# Patient Record
Sex: Female | Born: 1980 | Race: Black or African American | Hispanic: No | Marital: Married | Smoking: Former smoker
Health system: Southern US, Community
[De-identification: ages and names within clinical notes are randomized; demographics above are authoritative.]

## PROBLEM LIST (undated history)

## (undated) ENCOUNTER — Inpatient Hospital Stay (HOSPITAL_COMMUNITY): Payer: Self-pay

## (undated) HISTORY — PX: DILATION AND CURETTAGE OF UTERUS: SHX78

## (undated) HISTORY — PX: BREAST REDUCTION SURGERY: SHX8

## (undated) HISTORY — PX: BREAST SURGERY: SHX581

---

## 2001-05-13 ENCOUNTER — Emergency Department (HOSPITAL_COMMUNITY): Admission: EM | Admit: 2001-05-13 | Discharge: 2001-05-13 | Payer: Self-pay | Admitting: Emergency Medicine

## 2001-10-11 ENCOUNTER — Other Ambulatory Visit: Admission: RE | Admit: 2001-10-11 | Discharge: 2001-10-11 | Payer: Self-pay | Admitting: Obstetrics and Gynecology

## 2002-02-12 ENCOUNTER — Emergency Department (HOSPITAL_COMMUNITY): Admission: EM | Admit: 2002-02-12 | Discharge: 2002-02-12 | Payer: Self-pay

## 2002-06-09 ENCOUNTER — Ambulatory Visit (HOSPITAL_COMMUNITY): Admission: RE | Admit: 2002-06-09 | Discharge: 2002-06-09 | Payer: Self-pay | Admitting: Orthopedic Surgery

## 2002-06-09 ENCOUNTER — Encounter (INDEPENDENT_AMBULATORY_CARE_PROVIDER_SITE_OTHER): Payer: Self-pay | Admitting: Specialist

## 2004-05-17 ENCOUNTER — Emergency Department (HOSPITAL_COMMUNITY): Admission: EM | Admit: 2004-05-17 | Discharge: 2004-05-17 | Payer: Self-pay | Admitting: Emergency Medicine

## 2005-07-25 ENCOUNTER — Emergency Department (HOSPITAL_COMMUNITY): Admission: EM | Admit: 2005-07-25 | Discharge: 2005-07-25 | Payer: Self-pay | Admitting: Emergency Medicine

## 2010-03-10 ENCOUNTER — Ambulatory Visit (HOSPITAL_BASED_OUTPATIENT_CLINIC_OR_DEPARTMENT_OTHER)
Admission: RE | Admit: 2010-03-10 | Discharge: 2010-03-10 | Payer: Self-pay | Source: Home / Self Care | Attending: Otolaryngology | Admitting: Otolaryngology

## 2010-07-15 NOTE — Op Note (Signed)
   [name], Theresa Colon                          ACCOUNT NO.:  [id number]   MEDICAL RECORD NO.:  [id number]                   PATIENT TYPE:  AMB   LOCATION:  DAY                                  FACILITY:  [facility]   PHYSICIAN:  Marlowe Kays, M.D.               DATE OF BIRTH:  09/18/1980   DATE OF PROCEDURE:  03/10/2002  DATE OF DISCHARGE:                                 OPERATIVE REPORT   PREOPERATIVE DIAGNOSIS:  Ganglion cyst versus cyst of extensor tendon  sheath, right thumb.   POSTOPERATIVE DIAGNOSIS:  Apparent benign vascular tumor, right thumb.   OPERATION:  Excision of vascular tumor, right thumb.   SURGEON:  Illene Labrador. [place]plington, M.D.   ASSISTANT:  Nurse.   ANESTHESIA:  IV regional.   PATHOLOGY AND JUSTIFICATION FOR PROCEDURE:  She has had progressive pain and  swelling in her right thumb for about a year.  No known injury.  X-rays are  normal.  It is felt this is most likely a benign lesion, and no MRI was  obtained.  She is here today for surgical excision.   DESCRIPTION OF PROCEDURE:  Satisfactory IV, regional anesthesia, DuraPrep to  right upper extremity from mid forearm to fingertips, and this was draped in  a sterile field.  I made a dorsoradial incision over the mass which was  basically located over the distal portion of the first metacarpal.  Dorsal  sensor nerve was identified and protected.  A venous plexus immediately came  into view, and I dissected this out cautiously, protecting the dorsal  sensory nerve.  It appeared to take origin from a deep vein adjacent to the  extensor tendons.  This area was sectioned at the base and the specimen sent  for pathology.  I then cauterized the site of origin, and there was a small  little defect there, and I closed this over with interrupted 4-0 Vicryl.  I  likewise closed the subcutaneous tissue with the same and the skin with  running 5-0 nylon.  Betadine, Adaptic dry sterile dressing were applied.  Tourniquet was  released.  At the time of this dictation, she was on her way  to the recovery room in satisfactory condition with no known complications.                                               Marlowe Kays, M.D.    JA/MEDQ  D:  06/09/2002  T:  06/09/2002  Job:  [id number]

## 2010-08-02 ENCOUNTER — Other Ambulatory Visit: Payer: Self-pay | Admitting: Plastic Surgery

## 2011-04-11 ENCOUNTER — Other Ambulatory Visit: Payer: Self-pay | Admitting: Orthopedic Surgery

## 2011-10-17 ENCOUNTER — Encounter (HOSPITAL_BASED_OUTPATIENT_CLINIC_OR_DEPARTMENT_OTHER): Payer: Self-pay | Admitting: *Deleted

## 2011-10-17 ENCOUNTER — Emergency Department (HOSPITAL_BASED_OUTPATIENT_CLINIC_OR_DEPARTMENT_OTHER)
Admission: EM | Admit: 2011-10-17 | Discharge: 2011-10-17 | Disposition: A | Discharge status: Home / Self Care | Payer: 59 | Attending: Emergency Medicine | Admitting: Emergency Medicine

## 2011-10-17 DIAGNOSIS — R509 Fever, unspecified: Secondary | ICD-10-CM | POA: Insufficient documentation

## 2011-10-17 DIAGNOSIS — J069 Acute upper respiratory infection, unspecified: Secondary | ICD-10-CM | POA: Insufficient documentation

## 2011-10-17 MED ORDER — IBUPROFEN 800 MG PO TABS
800.0000 mg | ORAL_TABLET | Freq: Once | ORAL | Status: AC
  Administered 2011-10-17: 800 mg via ORAL
  Filled 2011-10-17: qty 1

## 2011-10-17 MED ORDER — HYDROCODONE-ACETAMINOPHEN 7.5-500 MG/15ML PO SOLN: 15.0000 mL | Freq: Four times a day (QID) | ORAL | Status: AC | PRN

## 2011-10-17 NOTE — ED Provider Notes (Signed)
History     CSN: [id number]  Arrival date & time 10/17/11  1821   First MD Initiated Contact with Patient 10/17/11 1844      Chief Complaint  Patient presents with  . Sore Throat  . Fever    (Consider location/radiation/quality/duration/timing/severity/associated sxs/prior treatment) Patient is a 31 y.o. female presenting with URI. The history is provided by the patient.  URI The primary symptoms include fever, fatigue, sore throat, cough and myalgias. Primary symptoms do not include rash. The current episode started yesterday. This is a new problem. The problem has been gradually worsening.  The fever began today. The maximum temperature recorded prior to her arrival was 101 to 101.9 F.  The onset of the illness is associated with exposure to sick contacts. Symptoms associated with the illness include chills, congestion and rhinorrhea.    History reviewed. No pertinent past medical history.  Past Surgical History  Procedure Date  . Breast surgery     History reviewed. No pertinent family history.  History  Substance Use Topics  . Smoking status: Not on file  . Smokeless tobacco: Not on file  . Alcohol Use: No    OB History    Grav Para Term Preterm Abortions TAB SAB Ect Mult Living                  Review of Systems  Constitutional: Positive for fever, chills and fatigue.  HENT: Positive for congestion, sore throat and rhinorrhea.   Respiratory: Positive for cough.   Musculoskeletal: Positive for myalgias.  Skin: Negative for rash.  All other systems reviewed and are negative.    Allergies  Review of patient's allergies indicates no known allergies.  Home Medications  No current outpatient prescriptions on file.  BP 122/72  Pulse 98  Temp 101.2 F (38.4 C) (Oral)  Resp 16  Ht 5\' 5"  (1.651 m)  Wt 230 lb (104.327 kg)  BMI 38.27 kg/m2  SpO2 100%  LMP 10/07/2011  Physical Exam  Nursing note and vitals reviewed. Constitutional: She is oriented to  person, place, and time. She appears well-developed and well-nourished. No distress.  HENT:  Head: Normocephalic and atraumatic.  Right Ear: Tympanic membrane and ear canal normal.  Left Ear: Tympanic membrane and ear canal normal.  Nose: Mucosal edema and rhinorrhea present.  Mouth/Throat: Mucous membranes are normal. Posterior oropharyngeal erythema present. No oropharyngeal exudate, posterior oropharyngeal edema or tonsillar abscesses.  Eyes: Conjunctivae and EOM are normal. Pupils are equal, round, and reactive to light.  Neck: Normal range of motion. Neck supple.  Cardiovascular: Normal rate, regular rhythm and intact distal pulses.   No murmur heard. Pulmonary/Chest: Effort normal and breath sounds normal. No respiratory distress. She has no wheezes. She has no rales.  Abdominal: Soft. She exhibits no distension. There is no tenderness. There is no rebound and no guarding.  Musculoskeletal: Normal range of motion. She exhibits no edema and no tenderness.  Lymphadenopathy:    She has no cervical adenopathy.  Neurological: She is alert and oriented to person, place, and time.  Skin: Skin is warm and dry. No rash noted. No erythema.  Psychiatric: She has a normal mood and affect. Her behavior is normal.    ED Course  Procedures (including critical care time)   Labs Reviewed  RAPID STREP SCREEN   No results found.   No diagnosis found.    MDM   Pt with symptoms consistent with viral URI.  Well appearing here.  No signs of  breathing difficulty  No signs of pharyngitis, otitis or abnormal abdominal findings.   Rapid strep neg.  Feel most likely viral in origin.        Gwyneth Sprout, MD 10/17/11 1935

## 2011-10-17 NOTE — ED Notes (Signed)
Pt c/o sore throat and fever  X 2 days

## 2011-10-18 LAB — STREP A DNA PROBE
Group A Strep Probe: NEGATIVE
Special Requests: NORMAL

## 2013-02-25 ENCOUNTER — Encounter (HOSPITAL_BASED_OUTPATIENT_CLINIC_OR_DEPARTMENT_OTHER): Payer: Self-pay | Admitting: Emergency Medicine

## 2013-02-25 ENCOUNTER — Emergency Department (HOSPITAL_BASED_OUTPATIENT_CLINIC_OR_DEPARTMENT_OTHER)
Admission: EM | Admit: 2013-02-25 | Discharge: 2013-02-25 | Disposition: A | Discharge status: Home / Self Care | Payer: No Typology Code available for payment source | Attending: Emergency Medicine | Admitting: Emergency Medicine

## 2013-02-25 DIAGNOSIS — F17200 Nicotine dependence, unspecified, uncomplicated: Secondary | ICD-10-CM | POA: Insufficient documentation

## 2013-02-25 DIAGNOSIS — J029 Acute pharyngitis, unspecified: Secondary | ICD-10-CM

## 2013-02-25 MED ORDER — ONDANSETRON 4 MG PO TBDP: 4.0000 mg | ORAL_TABLET | Freq: Three times a day (TID) | ORAL | Status: DC | PRN

## 2013-02-25 MED ORDER — HYDROCODONE-ACETAMINOPHEN 7.5-325 MG/15ML PO SOLN
10.0000 mL | Freq: Once | ORAL | Status: AC
  Administered 2013-02-25: 10 mL via ORAL
  Filled 2013-02-25: qty 15

## 2013-02-25 MED ORDER — HYDROCODONE-ACETAMINOPHEN 7.5-325 MG/15ML PO SOLN: 10.0000 mL | ORAL | Status: DC | PRN

## 2013-02-25 MED ORDER — ONDANSETRON 4 MG PO TBDP
4.0000 mg | ORAL_TABLET | Freq: Once | ORAL | Status: AC
  Administered 2013-02-25: 4 mg via ORAL
  Filled 2013-02-25: qty 1

## 2013-02-25 MED ORDER — DEXAMETHASONE SODIUM PHOSPHATE 10 MG/ML IJ SOLN
10.0000 mg | Freq: Once | INTRAMUSCULAR | Status: AC
  Administered 2013-02-25: 10 mg via INTRAMUSCULAR
  Filled 2013-02-25: qty 1

## 2013-02-25 NOTE — ED Notes (Signed)
[facility].  Pt reports taking all medications prescribed but not getting better.  Pt reports sore throat, headache, body aches and decrease fluid intake.  Took aleve prior to arrival

## 2013-02-25 NOTE — ED Provider Notes (Signed)
CSN: [id number]     Arrival date & time 02/25/13  0431 History   First MD Initiated Contact with Patient 02/25/13 0441     Chief Complaint  Patient presents with  . Sore Throat   (Consider location/radiation/quality/duration/timing/severity/associated sxs/prior Treatment) HPI This is a 32 year old female with a three-day history of sore throat, fever, headache, body aches and general malaise. She was seen in a walk-in clinic 2 days ago, diagnosed with influenza, and put on Tamiflu, Aleve and an unspecified gargle. A rapid strep test was negative. Despite these her sore throat is worse and she rates her pain a 10 out of 10 especially with swallowing. The pain is preventing her from coughing as well. She is having nausea but no vomiting and has had diarrhea.  History reviewed. No pertinent past medical history. Past Surgical History  Procedure Laterality Date  . Breast reduction surgery    . Breast surgery     No family history on file. History  Substance Use Topics  . Smoking status: Current Some Day Smoker    Types: Cigarettes  . Smokeless tobacco: Not on file  . Alcohol Use: No   OB History   Grav Para Term Preterm Abortions TAB SAB Ect Mult Living                 Review of Systems  All other systems reviewed and are negative.    Allergies  Review of patient's allergies indicates no known allergies.  Home Medications  No current outpatient prescriptions on file. BP 136/90  Pulse 86  Temp(Src) 99.7 F (37.6 C) (Oral)  Resp 20  Ht 5\' 5"  (1.651 m)  Wt 240 lb (108.863 kg)  BMI 39.94 kg/m2  SpO2 98%  LMP 02/07/2013  Physical Exam General: Well-developed, well-nourished female in no acute distress; appearance consistent with age of record HENT: normocephalic; atraumatic; pharyngeal erythema and exudate; no trismus; uvula midline; no dysphonia; TMs normal Eyes: pupils equal, round and reactive to light; extraocular muscles intact Neck: supple Heart: regular rate and  rhythm Lungs: clear to auscultation bilaterally Abdomen: soft; nondistended; nontender Extremities: No deformity; full range of motion Neurologic: Awake, alert and oriented; motor function intact in all extremities and symmetric; no facial droop Skin: Warm and dry Psychiatric: Flat affect    ED Course  Procedures (including critical care time)  MDM      Hanley Seamen, MD 02/25/13 [phone]

## 2013-02-27 ENCOUNTER — Encounter (HOSPITAL_BASED_OUTPATIENT_CLINIC_OR_DEPARTMENT_OTHER): Payer: Self-pay | Admitting: Emergency Medicine

## 2013-02-27 ENCOUNTER — Emergency Department (HOSPITAL_BASED_OUTPATIENT_CLINIC_OR_DEPARTMENT_OTHER)
Admission: EM | Admit: 2013-02-27 | Discharge: 2013-02-27 | Disposition: A | Discharge status: Home / Self Care | Payer: No Typology Code available for payment source | Attending: Emergency Medicine | Admitting: Emergency Medicine

## 2013-02-27 DIAGNOSIS — J039 Acute tonsillitis, unspecified: Secondary | ICD-10-CM | POA: Insufficient documentation

## 2013-02-27 DIAGNOSIS — F17200 Nicotine dependence, unspecified, uncomplicated: Secondary | ICD-10-CM | POA: Insufficient documentation

## 2013-02-27 DIAGNOSIS — H9209 Otalgia, unspecified ear: Secondary | ICD-10-CM | POA: Insufficient documentation

## 2013-02-27 DIAGNOSIS — R110 Nausea: Secondary | ICD-10-CM | POA: Insufficient documentation

## 2013-02-27 LAB — RAPID STREP SCREEN (MED CTR MEBANE ONLY): STREPTOCOCCUS, GROUP A SCREEN (DIRECT): NEGATIVE

## 2013-02-27 MED ORDER — HYDROCODONE-ACETAMINOPHEN 5-325 MG PO TABS
2.0000 | ORAL_TABLET | Freq: Once | ORAL | Status: AC
  Administered 2013-02-27: 2 via ORAL
  Filled 2013-02-27: qty 2

## 2013-02-27 MED ORDER — METHYLPREDNISOLONE SODIUM SUCC 125 MG IJ SOLR
125.0000 mg | Freq: Once | INTRAMUSCULAR | Status: AC
  Administered 2013-02-27: 125 mg via INTRAMUSCULAR
  Filled 2013-02-27: qty 2

## 2013-02-27 MED ORDER — HYDROCODONE-ACETAMINOPHEN 5-325 MG PO TABS: 2.0000 | ORAL_TABLET | ORAL | Status: DC | PRN

## 2013-02-27 MED ORDER — PENICILLIN V POTASSIUM 500 MG PO TABS: 500.0000 mg | ORAL_TABLET | Freq: Four times a day (QID) | ORAL | Status: AC

## 2013-02-27 NOTE — ED Provider Notes (Signed)
CSN: [id number]     Arrival date & time 02/27/13  1850 History   None    Chief Complaint  Patient presents with  . Influenza   (Consider location/radiation/quality/duration/timing/severity/associated sxs/prior Treatment) Patient is a 33 y.o. female presenting with flu symptoms. The history is provided by the patient. No language interpreter was used.  Influenza Presenting symptoms: fever, nausea and sore throat   Severity:  Severe Onset quality:  Gradual Duration:  7 days Progression:  Worsening Relieved by:  Nothing Worsened by:  Nothing tried Associated symptoms: ear pain   Risk factors: no diabetes problem     History reviewed. No pertinent past medical history. Past Surgical History  Procedure Laterality Date  . Breast reduction surgery    . Breast surgery     No family history on file. History  Substance Use Topics  . Smoking status: Current Some Day Smoker    Types: Cigarettes  . Smokeless tobacco: Not on file  . Alcohol Use: No   OB History   Grav Para Term Preterm Abortions TAB SAB Ect Mult Living                 Review of Systems  Constitutional: Positive for fever.  HENT: Positive for ear pain and sore throat.   Gastrointestinal: Positive for nausea.  All other systems reviewed and are negative.    Allergies  Review of patient's allergies indicates no known allergies.  Home Medications   Current Outpatient Rx  Name  Route  Sig  Dispense  Refill  . HYDROcodone-acetaminophen (HYCET) 7.5-325 mg/15 ml solution   Oral   Take 10 mLs by mouth every 4 (four) hours as needed (for pain).   120 mL   0   . ondansetron (ZOFRAN ODT) 4 MG disintegrating tablet   Oral   Take 1 tablet (4 mg total) by mouth every 8 (eight) hours as needed for nausea or vomiting. 4mg  ODT q4 hours prn nausea/vomit   10 tablet   0    BP 142/78  Pulse 85  Temp(Src) 98.3 F (36.8 C) (Oral)  Resp 18  Ht 5\' 5"  (1.651 m)  Wt 240 lb (108.863 kg)  BMI 39.94 kg/m2  SpO2 100%   LMP 02/07/2013 Physical Exam  Nursing note and vitals reviewed. Constitutional: She appears well-developed.  HENT:  Head: Normocephalic and atraumatic.  Mouth/Throat: Oropharyngeal exudate present.  Eyes: Conjunctivae are normal. Pupils are equal, round, and reactive to light.  Neck: Normal range of motion.  Cardiovascular: Normal rate, regular rhythm and normal heart sounds.   Pulmonary/Chest: Effort normal and breath sounds normal.  Abdominal: Soft. Bowel sounds are normal.  Musculoskeletal: Normal range of motion.  Neurological: She is alert.  Skin: Skin is warm.    ED Course  Procedures (including critical care time) Labs Review Labs Reviewed  RAPID STREP SCREEN  CULTURE, GROUP A STREP   Imaging Review No results found.  EKG Interpretation   None       MDM   1. Tonsillitis    Strep is negative.   I will treat with pcn due to exudate and swelling.   Pt advised to see Dr. Kathrynn RunningManning for rehceck in 2 days    Elson AreasLeslie K Elasia Furnish, PA-C 02/27/13 2149

## 2013-02-27 NOTE — Discharge Instructions (Signed)
Sore Throat A sore throat is pain, burning, irritation, or scratchiness of the throat. There is often pain or tenderness when swallowing or talking. A sore throat may be accompanied by other symptoms, such as coughing, sneezing, fever, and swollen neck glands. A sore throat is often the first sign of another sickness, such as a cold, flu, strep throat, or mononucleosis (commonly known as mono). Most sore throats go away without medical treatment. CAUSES  The most common causes of a sore throat include:  A viral infection, such as a cold, flu, or mono.  A bacterial infection, such as strep throat, tonsillitis, or whooping cough.  Seasonal allergies.  Dryness in the air.  Irritants, such as smoke or pollution.  Gastroesophageal reflux disease (GERD). HOME CARE INSTRUCTIONS   Only take over-the-counter medicines as directed by your caregiver.  Drink enough fluids to keep your urine clear or pale yellow.  Rest as needed.  Try using throat sprays, lozenges, or sucking on hard candy to ease any pain (if older than 4 years or as directed).  Sip warm liquids, such as broth, herbal tea, or warm water with honey to relieve pain temporarily. You may also eat or drink cold or frozen liquids such as frozen ice pops.  Gargle with salt water (mix 1 tsp salt with 8 oz of water).  Do not smoke and avoid secondhand smoke.  Put a cool-mist humidifier in your bedroom at night to moisten the air. You can also turn on a hot shower and sit in the bathroom with the door closed for 5 10 minutes. SEEK IMMEDIATE MEDICAL CARE IF:  You have difficulty breathing.  You are unable to swallow fluids, soft foods, or your saliva.  You have increased swelling in the throat.  Your sore throat does not get better in 7 days.  You have nausea and vomiting.  You have a fever or persistent symptoms for more than 2 3 days.  You have a fever and your symptoms suddenly get worse. MAKE SURE YOU:   Understand  these instructions.  Will watch your condition.  Will get help right away if you are not doing well or get worse. Document Released: 03/23/2004 Document Revised: 01/31/2012 Document Reviewed: 10/22/2011 Exit[employer] Patient Information 2014 [employer].  

## 2013-02-27 NOTE — ED Provider Notes (Signed)
Medical screening examination/treatment/procedure(s) were performed by non-physician practitioner and as supervising physician I was immediately available for consultation/collaboration.  EKG Interpretation   None         Sukari Grist, MD 02/27/13 2240 

## 2013-02-27 NOTE — ED Notes (Signed)
Sore throat headache aching all over ear pain. Was diagnosed with flu 2 days ago and she is no better.

## 2013-03-01 LAB — CULTURE, GROUP A STREP

## 2013-07-14 ENCOUNTER — Inpatient Hospital Stay (HOSPITAL_COMMUNITY): Payer: No Typology Code available for payment source

## 2013-07-14 ENCOUNTER — Inpatient Hospital Stay (HOSPITAL_COMMUNITY)
Admission: AD | Admit: 2013-07-14 | Discharge: 2013-07-14 | Disposition: A | Discharge status: Home / Self Care | Payer: No Typology Code available for payment source | Source: Ambulatory Visit | Attending: Obstetrics and Gynecology | Admitting: Obstetrics and Gynecology

## 2013-07-14 ENCOUNTER — Encounter (HOSPITAL_COMMUNITY): Payer: Self-pay

## 2013-07-14 DIAGNOSIS — O209 Hemorrhage in early pregnancy, unspecified: Secondary | ICD-10-CM | POA: Insufficient documentation

## 2013-07-14 DIAGNOSIS — R109 Unspecified abdominal pain: Secondary | ICD-10-CM | POA: Diagnosis not present

## 2013-07-14 DIAGNOSIS — Z87891 Personal history of nicotine dependence: Secondary | ICD-10-CM | POA: Insufficient documentation

## 2013-07-14 DIAGNOSIS — O360990 Maternal care for other rhesus isoimmunization, unspecified trimester, not applicable or unspecified: Secondary | ICD-10-CM | POA: Diagnosis not present

## 2013-07-14 LAB — HCG, QUANTITATIVE, PREGNANCY: hCG, Beta Chain, Quant, S: 3194 m[IU]/mL — ABNORMAL HIGH (ref <5)

## 2013-07-14 LAB — WET PREP, GENITAL
Clue Cells Wet Prep HPF POC: NONE SEEN
TRICH WET PREP: NONE SEEN
YEAST WET PREP: NONE SEEN

## 2013-07-14 NOTE — MAU Note (Signed)
Last intercourse 1-2 weeks ago. Sees more blood on tissue. Noted small clot yesterday when wiping post voiding. Intermittent cramping, none present now.

## 2013-07-14 NOTE — MAU Provider Note (Signed)
History   Patient is a 33y.o. G2P0010 at 7.2wks who presents, via EMS, for vaginal bleeding.  Patient states that she started having light spotting, with wiping, 3 days ago. Today patient was at work and started having bright red vaginal bleeding and abdominal cramping.  Patient called office and scheduled appt for today, but on the way was pulled over for speeding and brought in via EMS.  Patient reports that she did have to wear a pad, but it was not saturated.  Patient denies recent intercourse or GI issues.  Currently taking medication for UTI.  There are no active problems to display for this patient.   Chief Complaint  Patient presents with  . Vaginal Bleeding   HPI  OB History   Grav Para Term Preterm Abortions TAB SAB Ect Mult Living   2    1 1           History reviewed. No pertinent past medical history.  Past Surgical History  Procedure Laterality Date  . Breast reduction surgery    . Breast surgery    . Dilation and curettage of uterus      History reviewed. No pertinent family history.  History  Substance Use Topics  . Smoking status: Former Smoker    Types: Cigarettes  . Smokeless tobacco: Never Used  . Alcohol Use: No    Allergies: No Known Allergies  Prescriptions prior to admission  Medication Sig Dispense Refill  . Acetaminophen (TYLENOL PO) Take 2 tablets by mouth every 6 (six) hours as needed (pain).      . cephALEXin (KEFLEX) 500 MG capsule Take 500 mg by mouth 2 (two) times daily. 7 day dose started 2days ago for UTI      . Prenatal Vit-Fe Fumarate-FA (PRENATAL MULTIVITAMIN) TABS tablet Take 1 tablet by mouth daily at 12 noon.        ROS See HPI Physical Exam   Blood pressure 135/68, pulse 83, temperature 98.5 F (36.9 C), temperature source Oral, resp. rate 20, height 5' 4.5" (1.638 m), weight 259 lb 12.8 oz (117.845 kg), last menstrual period 05/10/2013, SpO2 99.00%.  Physical Exam  Constitutional: She is oriented to person, place, and time.  She appears well-developed and well-nourished.  Cardiovascular: Normal rate.   Respiratory: Effort normal.  GI: Soft. There is tenderness in the right lower quadrant. There is no guarding.  Genitourinary: Rectum normal. Cervix exhibits discharge. Cervix exhibits no motion tenderness and no friability. There is bleeding around the vagina. Vaginal discharge found.  Speculum Exam additional findings: Labia appears normal. Vaginal vault: Bright red blood noted in the posterior fornix. Cervix: Appears slightly open with bloody mucous coming from os.   BiManual Exam Findings: Unable to palpate ovaries Uterus ~6-8wk size Cervix: FT/Soft/Long  Neurological: She is alert and oriented to person, place, and time.  Skin: Skin is warm and dry.  Psychiatric: She has a normal mood and affect.    ED Course  Assessment: IUP at 7.2wks Vaginal Bleeding Rh Positive  Plan: -Speculum exam performed with findings as noted: GC/CT, Wet Prep Obtained -US for viability/cardiac activity to compare to initial office US on 5/5 at 5.3wks  Follow Up (1800) US Results:  FINDINGS: Intrauterine gestational sac: Visualized/normal in shape.  Yolk sac: Present Embryo: Present Cardiac Activity: Not seen  CRL: 3.1 mm 6 w 0 d US EDC: 03/09/2014  Maternal uterus/adnexae: Small subchorionic hemorrhage identified.  Right corpus luteum cyst is noted. Left ovary has a normal  appearance. 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 No free pelvic fluid identified.   IMPRESSION:  1. Single intrauterine embryo measuring 3.1 mm.  2. This dating differs from reported previous ultrasound and  correlation is recommended with that exam.  3. Embryonic cardiac activity would not be expected at this  crown-rump length to determine viability.  4. Follow-up ultrasound is recommended in 14 days to document  presence of cardiac motion.  -Dr. AVS consulted and findings discussed -Discussed  results with patient and possible outcomes: threatened abortion vs. 1st trimester bleeding -Beta HcG, now and repeat in 48 hours -Bleeding precautions given -Discharge to home  Gerrit HeckJessica Juwon Scripter CNM, MSN 07/14/2013 4:35 PM

## 2013-07-14 NOTE — MAU Note (Signed)
Patient is currently taking medication for a UTI.

## 2013-07-14 NOTE — MAU Note (Signed)
Patient states she has had an early ultrasound at [facility]. States she has been spotting for about 4 days but today has been a little heavier. Has had a headache and feels lightheaded today. Arrived to MAU via EMS. Abdominal cramping today.

## 2013-07-14 NOTE — Discharge Instructions (Signed)
Vaginal Bleeding During Pregnancy, First Trimester °A small amount of bleeding (spotting) from the vagina is relatively common in early pregnancy. It usually stops on its own. Various things may cause bleeding or spotting in early pregnancy. Some bleeding may be related to the pregnancy, and some may not. In most cases, the bleeding is normal and is not a problem. However, bleeding can also be a sign of something serious. Be sure to tell your health care provider about any vaginal bleeding right away. °Some possible causes of vaginal bleeding during the first trimester include: °· Infection or inflammation of the cervix. °· Growths (polyps) on the cervix. °· Miscarriage or threatened miscarriage. °· Pregnancy tissue has developed outside of the uterus and in a fallopian tube (tubal pregnancy). °· Tiny cysts have developed in the uterus instead of pregnancy tissue (molar pregnancy). °HOME CARE INSTRUCTIONS  °Watch your condition for any changes. The following actions may help to lessen any discomfort you are feeling: °· Follow your health care provider's instructions for limiting your activity. If your health care provider orders bed rest, you may need to stay in bed and only get up to use the bathroom. However, your health care provider may allow you to continue light activity. °· If needed, make plans for someone to help with your regular activities and responsibilities while you are on bed rest. °· Keep track of the number of pads you use each day, how often you change pads, and how soaked (saturated) they are. Write this down. °· Do not use tampons. Do not douche. °· Do not have sexual intercourse or orgasms until approved by your health care provider. °· If you pass any tissue from your vagina, save the tissue so you can show it to your health care provider. °· Only take over-the-counter or prescription medicines as directed by your health care provider. °· Do not take aspirin because it can make you  bleed. °· Keep all follow-up appointments as directed by your health care provider. °SEEK MEDICAL CARE IF: °· You have any vaginal bleeding during any part of your pregnancy. °· You have cramps or labor pains. °SEEK IMMEDIATE MEDICAL CARE IF:  °· You have severe cramps in your back or belly (abdomen). °· You have a fever, not controlled by medicine. °· You pass large clots or tissue from your vagina. °· Your bleeding increases. °· You feel lightheaded or weak, or you have fainting episodes. °· You have chills. °· You are leaking fluid or have a gush of fluid from your vagina. °· You pass out while having a bowel movement. °MAKE SURE YOU: °· Understand these instructions. °· Will watch your condition. °· Will get help right away if you are not doing well or get worse. °Document Released: 11/23/2004 Document Revised: 12/04/2012 Document Reviewed: 10/21/2012 °Exit[employer]® Patient Information ©2014 [employer]. ° °

## 2013-07-14 NOTE — Progress Notes (Signed)
JEmly, CNM notified pt in short stay area for bleeding and cramping. U/S ordered and will come see pt

## 2013-07-15 LAB — GC/CHLAMYDIA PROBE AMP
CT PROBE, AMP APTIMA: NEGATIVE
GC Probe RNA: NEGATIVE

## 2013-07-16 ENCOUNTER — Inpatient Hospital Stay (HOSPITAL_COMMUNITY)
Admission: AD | Admit: 2013-07-16 | Discharge: 2013-07-16 | Disposition: A | Discharge status: Home / Self Care | Payer: No Typology Code available for payment source | Source: Ambulatory Visit | Attending: Obstetrics and Gynecology | Admitting: Obstetrics and Gynecology

## 2013-07-16 ENCOUNTER — Inpatient Hospital Stay (HOSPITAL_COMMUNITY): Payer: No Typology Code available for payment source

## 2013-07-16 ENCOUNTER — Encounter (HOSPITAL_COMMUNITY): Payer: Self-pay | Admitting: *Deleted

## 2013-07-16 DIAGNOSIS — R42 Dizziness and giddiness: Secondary | ICD-10-CM | POA: Insufficient documentation

## 2013-07-16 DIAGNOSIS — R109 Unspecified abdominal pain: Secondary | ICD-10-CM | POA: Insufficient documentation

## 2013-07-16 DIAGNOSIS — O200 Threatened abortion: Secondary | ICD-10-CM | POA: Diagnosis not present

## 2013-07-16 DIAGNOSIS — O209 Hemorrhage in early pregnancy, unspecified: Secondary | ICD-10-CM | POA: Diagnosis present

## 2013-07-16 DIAGNOSIS — Z87891 Personal history of nicotine dependence: Secondary | ICD-10-CM | POA: Insufficient documentation

## 2013-07-16 DIAGNOSIS — O039 Complete or unspecified spontaneous abortion without complication: Secondary | ICD-10-CM | POA: Diagnosis present

## 2013-07-16 LAB — CBC
HEMATOCRIT: 36.3 % (ref 36.0–46.0)
Hemoglobin: 12.3 g/dL (ref 12.0–15.0)
MCH: 29.1 pg (ref 26.0–34.0)
MCHC: 33.9 g/dL (ref 30.0–36.0)
MCV: 85.8 fL (ref 78.0–100.0)
Platelets: 373 10*3/uL (ref 150–400)
RBC: 4.23 MIL/uL (ref 3.87–5.11)
RDW: 12.7 % (ref 11.5–15.5)
WBC: 8.9 10*3/uL (ref 4.0–10.5)

## 2013-07-16 MED ORDER — LACTATED RINGERS IV BOLUS (SEPSIS)
500.0000 mL | Freq: Once | INTRAVENOUS | Status: AC
  Administered 2013-07-16: 1000 mL via INTRAVENOUS

## 2013-07-16 MED ORDER — LACTATED RINGERS IV SOLN: INTRAVENOUS | Status: DC

## 2013-07-16 MED ORDER — HYDROMORPHONE HCL PF 1 MG/ML IJ SOLN
1.0000 mg | INTRAMUSCULAR | Status: DC | PRN
  Administered 2013-07-16: 1 mg via INTRAVENOUS
  Filled 2013-07-16: qty 1

## 2013-07-16 MED ORDER — OXYCODONE-ACETAMINOPHEN 2.5-325 MG PO TABS: 1.0000 | ORAL_TABLET | Freq: Three times a day (TID) | ORAL | Status: AC | PRN

## 2013-07-16 NOTE — MAU Provider Note (Signed)
History   33 yo G2P0010 at 7 4/7 weeks by dates, but 6 2/7 weeks by US, presented unannounced c/o increased bleeding today, dizziness/weakness, and lower abdominal cramping.  Has been seen 5/18 in MAU for bleeding, with QHCG of 3194 and US showing SIUP, [redacted] week gestation, and no FHR, small SCH. Evaluation at office on 5/5 for 1st trimester bleeding--7 3/7 weeks by LMP, but  5 3/7 weeks by US that day, SIGS, no fetal pole.  Had repeat QHCG in office today = 2814.  A+ type.  Last meal at 12 noon--spaghetti.   Patient Active Problem List   Diagnosis Date Noted  . SAB (spontaneous abortion) 07/16/2013  . Severe obesity (BMI >= 40) 07/16/2013  . First trimester bleeding 07/16/2013    Chief Complaint  Patient presents with  . Vaginal Bleeding  . Threatened Miscarriage   HPI:  As above  OB History   Grav Para Term Preterm Abortions TAB SAB Ect Mult Living   2    1 1           No past medical history on file.  Past Surgical History  Procedure Laterality Date  . Breast reduction surgery    . Breast surgery    . Dilation and curettage of uterus    Excision of benign vascular of thumb--02/2002  No family history on file.  History  Substance Use Topics  . Smoking status: Former Smoker    Types: Cigarettes  . Smokeless tobacco: Never Used  . Alcohol Use: No    Allergies: No Known Allergies  Prescriptions prior to admission  Medication Sig Dispense Refill  . acetaminophen (TYLENOL) 500 MG tablet Take 1,000 mg by mouth every 6 (six) hours as needed for moderate pain.      . cephALEXin (KEFLEX) 500 MG capsule Take 500 mg by mouth 2 (two) times daily. 7 day dose started 2days ago for UTI      . Prenatal Vit-Fe Fumarate-FA (PRENATAL MULTIVITAMIN) TABS tablet Take 1 tablet by mouth daily at 12 noon.        ROS:  Cramping, moderate bleeding, dizziness Physical Exam   Blood pressure 121/67, pulse 92, temperature 98.6 F (37 C), temperature source Oral, resp. rate 18, last  menstrual period 05/10/2013.  Physical Exam Sporadic cramping Chest clear Heart RRR without murmur Abd soft, NT Pelvic--Small amount of blood in vault, with likely gestational sac in posterior fornix.  Cervix approx 1 cm, minimal active bleeding.  Uterus small, NT. Ext WNL  ED Course  Assessment: Likely complete SAB   Plan: Consulted with Dr. Su Hiltoberts. IV hydration Dilaudid CBC Transvaginal US to evaluate for retained POC. Specimen to pathology. Support to patient and husband for loss. Will observe bleeding and cramping at present--anticipate may decrease since passing of gestational sac. Theresa Colon, CNM, will f/u on patient status.   Nigel BridgemanVicki Latham CNM, MSN 07/16/2013 5:55 PM   Follow Up (1940) US with no POC noted in uterus. Fibroids seen Patient reports minimal pain, but requests some pain medication Patient off of work until next week, but informed she may return earlier if desired. Patient to follow up in 2 weeks for repeat bHcG Discussed bleeding precautions Call if you have any questions or concerns prior to your next visit.   Theresa Colon, CNM 07/16/2013 1955

## 2013-07-16 NOTE — MAU Note (Signed)
Pathology specimen to lab.

## 2013-07-16 NOTE — Discharge Instructions (Signed)
Miscarriage A miscarriage is the sudden loss of an unborn baby (fetus) before the 20th week of pregnancy. Most miscarriages happen in the first 3 months of pregnancy. Sometimes, it happens before a woman even knows she is pregnant. A miscarriage is also called a "spontaneous miscarriage" or "early pregnancy loss." Having a miscarriage can be an emotional experience. Talk with your caregiver about any questions you may have about miscarrying, the grieving process, and your future pregnancy plans. CAUSES   Problems with the fetal chromosomes that make it impossible for the baby to develop normally. Problems with the baby's genes or chromosomes are most often the result of errors that occur, by chance, as the embryo divides and grows. The problems are not inherited from the parents.  Infection of the cervix or uterus.   Hormone problems.   Problems with the cervix, such as having an incompetent cervix. This is when the tissue in the cervix is not strong enough to hold the pregnancy.   Problems with the uterus, such as an abnormally shaped uterus, uterine fibroids, or congenital abnormalities.   Certain medical conditions.   Smoking, drinking alcohol, or taking illegal drugs.   Trauma.  Often, the cause of a miscarriage is unknown.  SYMPTOMS   Vaginal bleeding or spotting, with or without cramps or pain.  Pain or cramping in the abdomen or lower back.  Passing fluid, tissue, or blood clots from the vagina. DIAGNOSIS  Your caregiver will perform a physical exam. You may also have an ultrasound to confirm the miscarriage. Blood or urine tests may also be ordered. TREATMENT   Sometimes, treatment is not necessary if you naturally pass all the fetal tissue that was in the uterus. If some of the fetus or placenta remains in the body (incomplete miscarriage), tissue left behind may become infected and must be removed. Usually, a dilation and curettage (D and C) procedure is performed.  During a D and C procedure, the cervix is widened (dilated) and any remaining fetal or placental tissue is gently removed from the uterus.  Antibiotic medicines are prescribed if there is an infection. Other medicines may be given to reduce the size of the uterus (contract) if there is a lot of bleeding.  If you have Rh negative blood and your baby was Rh positive, you will need a Rh immunoglobulin shot. This shot will protect any future baby from having Rh blood problems in future pregnancies. HOME CARE INSTRUCTIONS   Your caregiver may order bed rest or may allow you to continue light activity. Resume activity as directed by your caregiver.  Have someone help with home and family responsibilities during this time.   Keep track of the number of sanitary pads you use each day and how soaked (saturated) they are. Write down this information.   Do not use tampons. Do not douche or have sexual intercourse until approved by your caregiver.   Only take over-the-counter or prescription medicines for pain or discomfort as directed by your caregiver.   Do not take aspirin. Aspirin can cause bleeding.   Keep all follow-up appointments with your caregiver.   If you or your partner have problems with grieving, talk to your caregiver or seek counseling to help cope with the pregnancy loss. Allow enough time to grieve before trying to get pregnant again.  SEEK IMMEDIATE MEDICAL CARE IF:   You have severe cramps or pain in your back or abdomen.  You have a fever.  You pass large blood clots (walnut-sized   or larger) ortissue from your vagina. Save any tissue for your caregiver to inspect.   Your bleeding increases.   You have a thick, bad-smelling vaginal discharge.  You become lightheaded, weak, or you faint.   You have chills.  MAKE SURE YOU:  Understand these instructions.  Will watch your condition.  Will get help right away if you are not doing well or get  worse. Document Released: 08/09/2000 Document Revised: 06/10/2012 Document Reviewed: 04/04/2011 [employer] Patient Information 2014 [employer].  

## 2013-07-16 NOTE — MAU Note (Signed)
Was seen on Monday, was bleeding and having pain, both are much worse today,  Had US, baby still didn't have a heartbeat. Was told possible miscarriage.  Today started gushing out blood and clots. Extreme cramping

## 2013-07-16 NOTE — MAU Note (Signed)
Sabas SousJ. Emly CNM notified of pt.  Will be in to see pt.

## 2013-12-29 ENCOUNTER — Encounter (HOSPITAL_COMMUNITY): Payer: Self-pay | Admitting: *Deleted

## 2014-05-19 ENCOUNTER — Encounter (HOSPITAL_COMMUNITY): Payer: Self-pay | Admitting: *Deleted

## 2015-12-31 IMAGING — US US OB COMP LESS 14 WK
1 series · 13 of 28 positions shown · non-contrast
Comparison: None.

CLINICAL DATA: Pain and cramping. Early pregnant. History of
smoking, obesity. EDC is 02/28/2014 by a reported office ultrasound.

EXAM:
OBSTETRIC <14 WK US AND TRANSVAGINAL OB US
TECHNIQUE: Both transabdominal and transvaginal ultrasound examinations were
performed for complete evaluation of the gestation as well as the
maternal uterus, adnexal regions, and pelvic cul-de-sac.
Transvaginal technique was performed to assess early pregnancy.

[Series 1: us ob comp less 14 wks · 79 acquisitions, 13 frames shown]
[im 3/79]
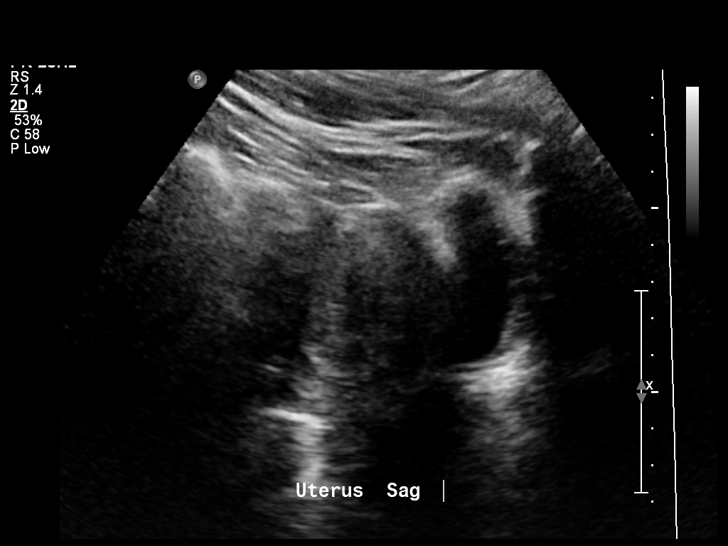
[im 9/79]
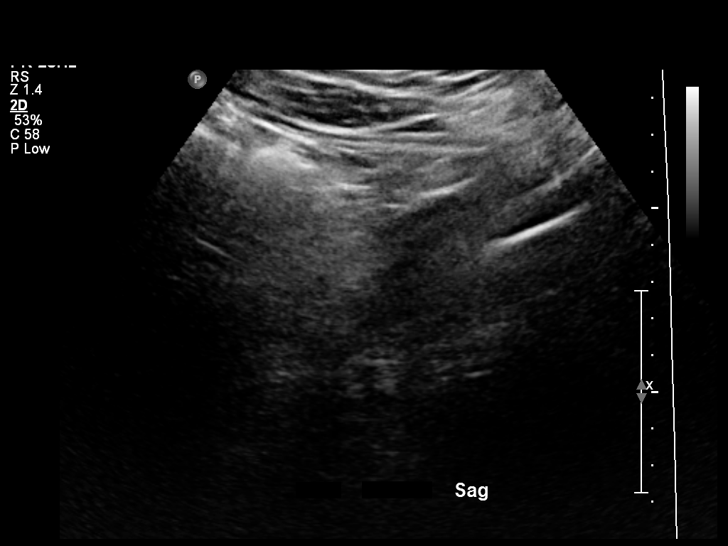
[im 15/79]
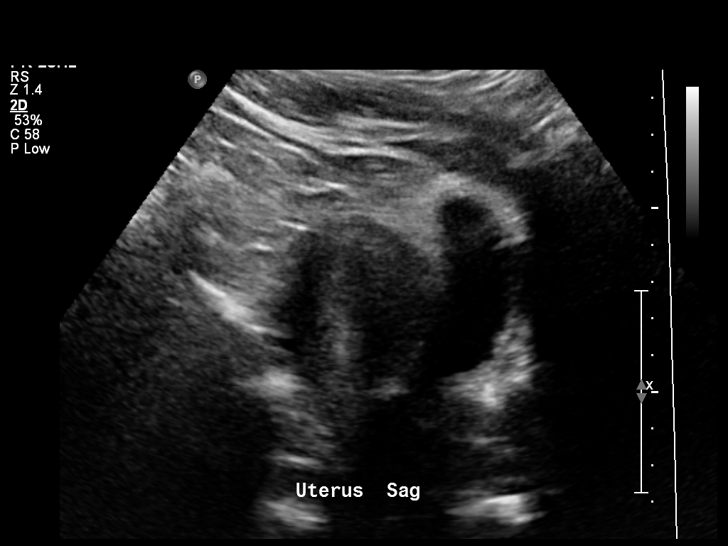
[im 21/79]
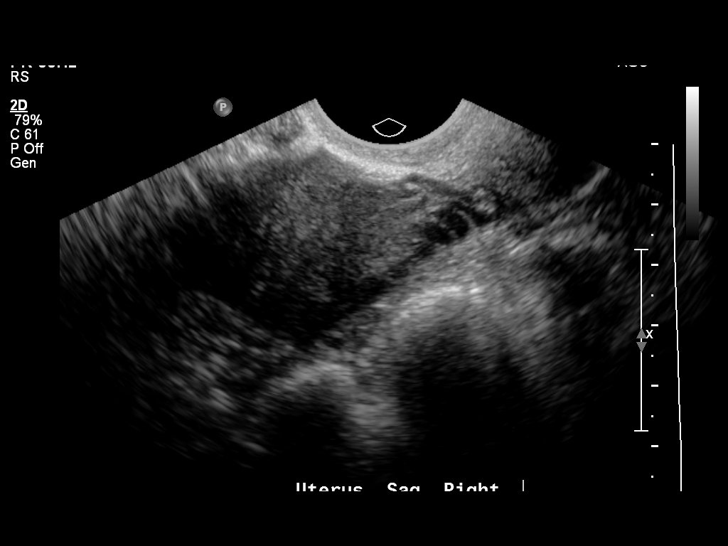
[im 27/79]
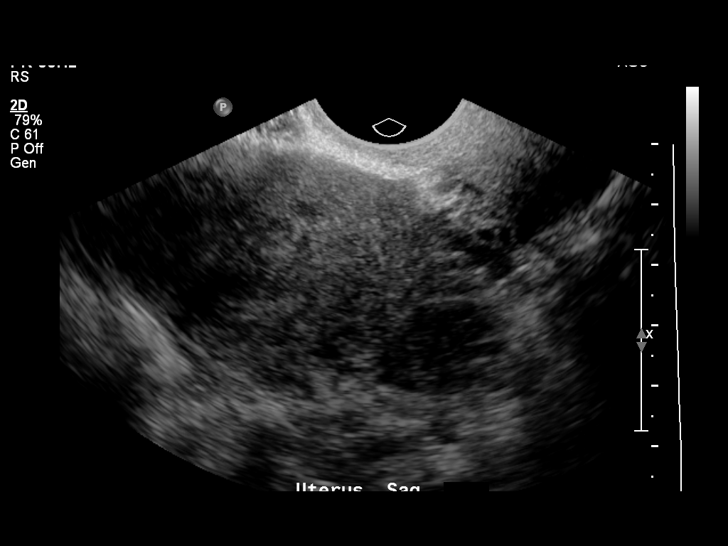
[im 32/79]
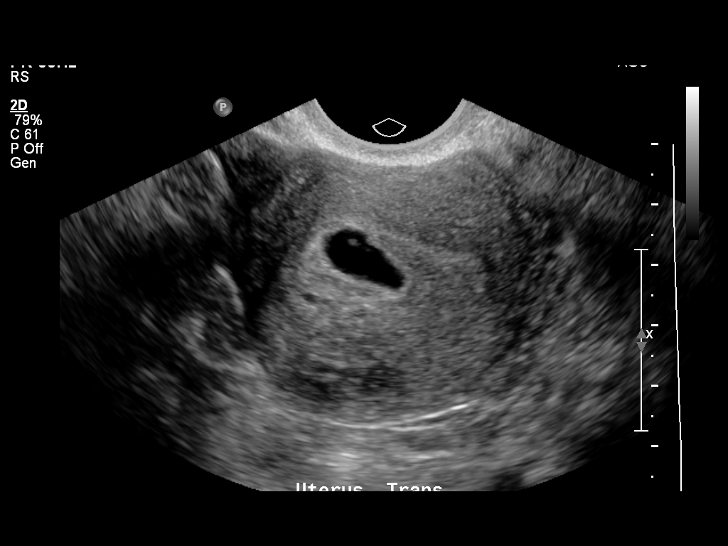
[im 41/79]
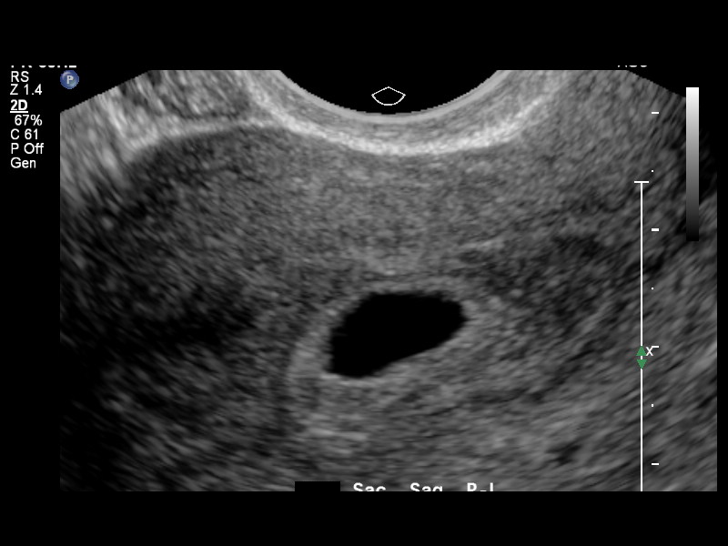
[im 47/79]
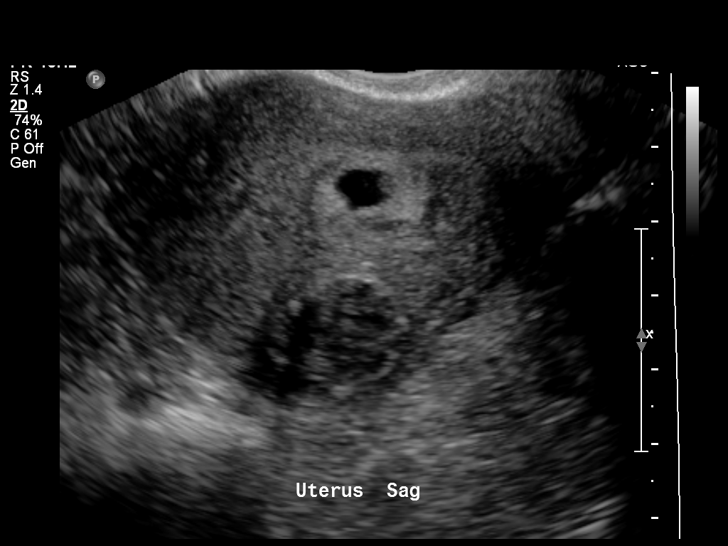
[im 53/79]
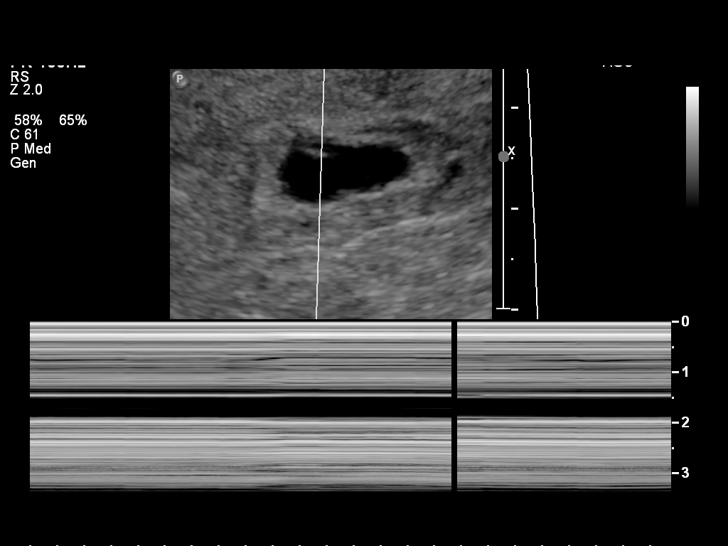
[im 58/79]
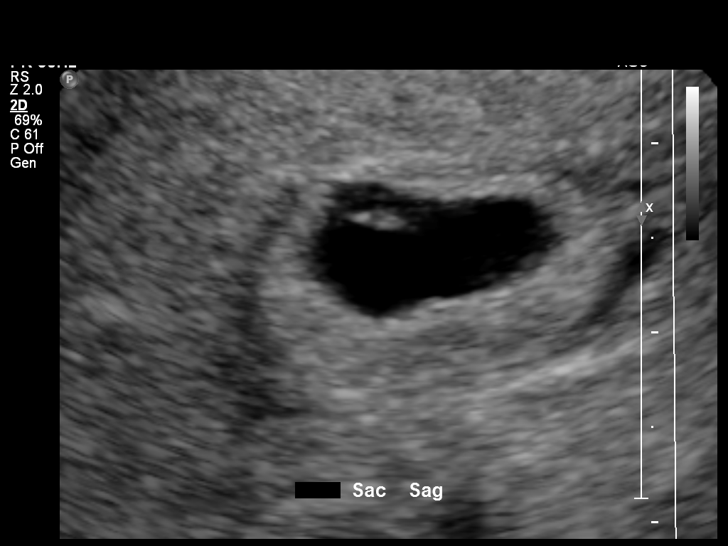
[im 64/79]
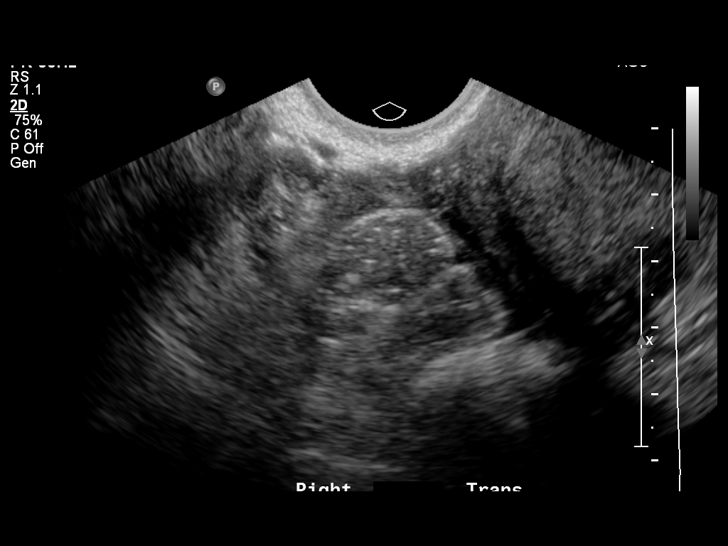
[im 70/79]
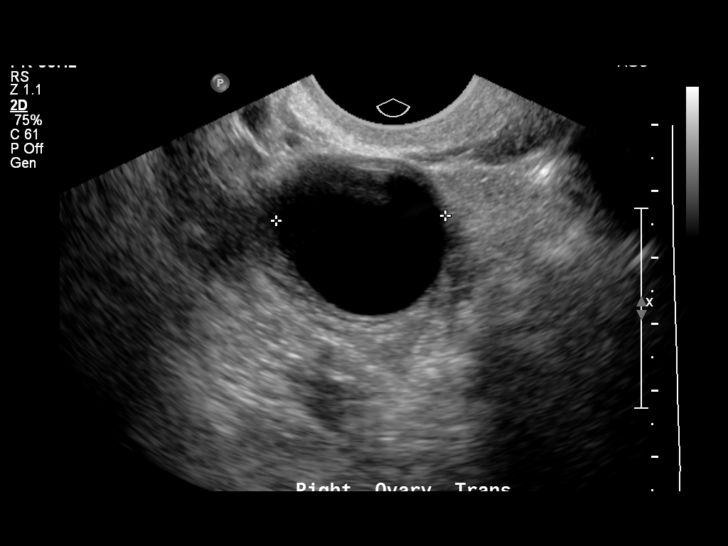
[im 76/79]
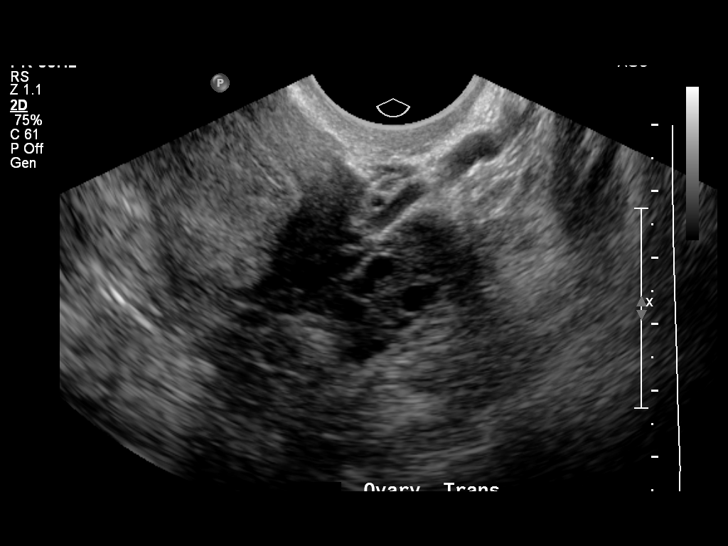

[13 of 28 positions shown; findings below may reference images not displayed]

FINDINGS: Intrauterine gestational sac: Visualized/normal in shape.

Yolk sac:  Present

Embryo:  Present

Cardiac Activity: Not seen

CRL:   3.1 mm 6 w 0 d                  US EDC: 03/09/2014

Maternal uterus/adnexae: Small subchorionic hemorrhage identified.
Right corpus luteum cyst is noted. Left ovary has a normal
appearance. There are 2 uterine fibroids measuring 2.0 x 1.5 x
cm and 2.4 x 1.9 x 2.8 cm. No free pelvic fluid identified.
IMPRESSION: 1. Single intrauterine embryo measuring 3.1 mm.
2. This dating differs from reported previous ultrasound and
correlation is recommended with that exam.
3. Embryonic cardiac activity would not be expected at this
crown-rump length to determine viability.
4. Follow-up ultrasound is recommended in 14 days to document
presence of cardiac motion.

## 2016-01-02 IMAGING — US US OB TRANSVAGINAL
1 series · 14 of 28 positions shown · non-contrast
Comparison: Ob ultrasound 07/14/2013.

CLINICAL DATA: Increased vaginal bleeding and cramping.

EXAM:
TRANSVAGINAL OB ULTRASOUND
TECHNIQUE: Transvaginal ultrasound was performed for complete evaluation of the
gestation as well as the maternal uterus, adnexal regions, and
pelvic cul-de-sac.

[Series 1: us ob transvaginal · 42 acquisitions, 14 frames shown]
[im 2/42]
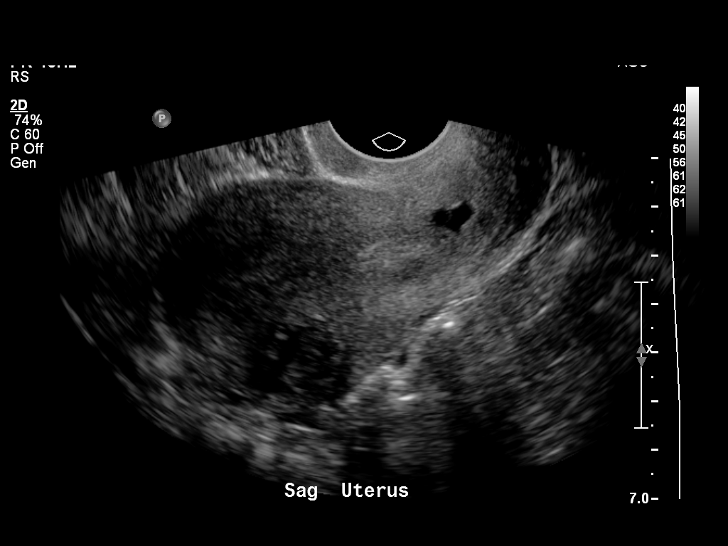
[im 5/42]
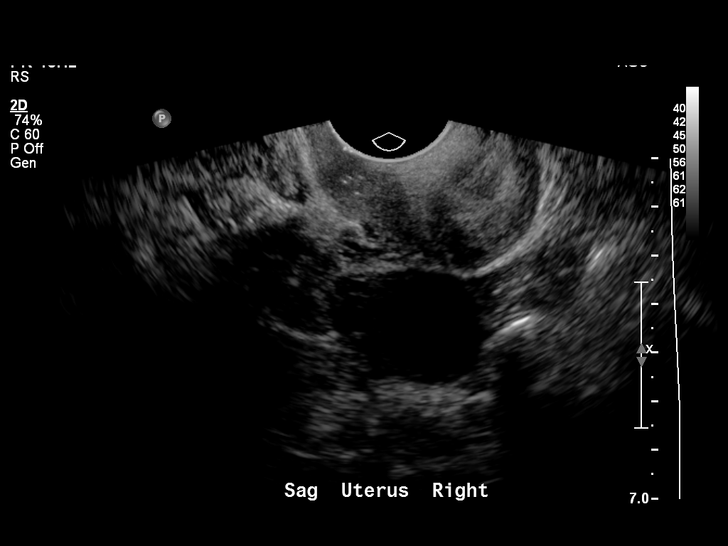
[im 8/42]
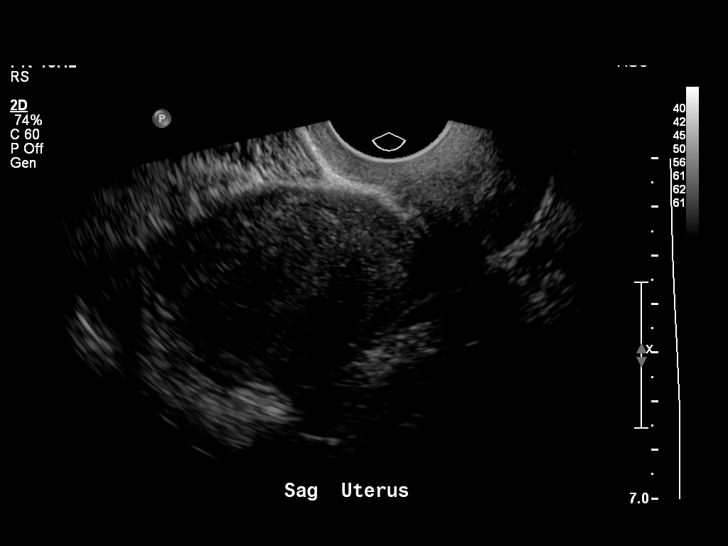
[im 11/42]
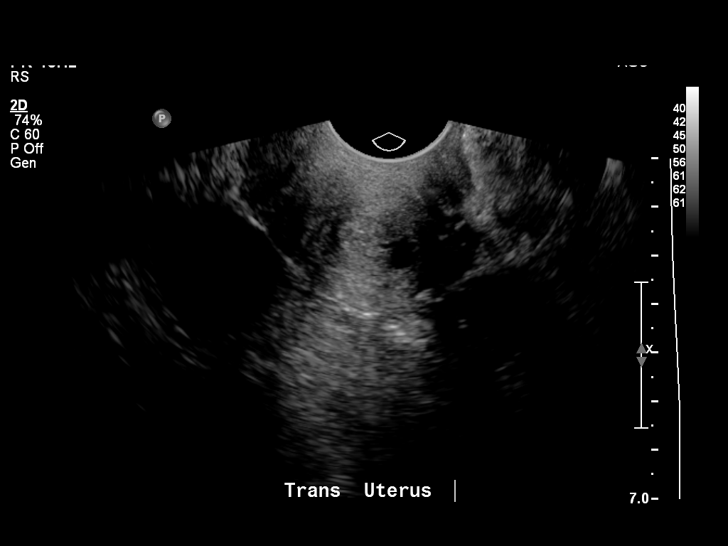
[im 14/42]
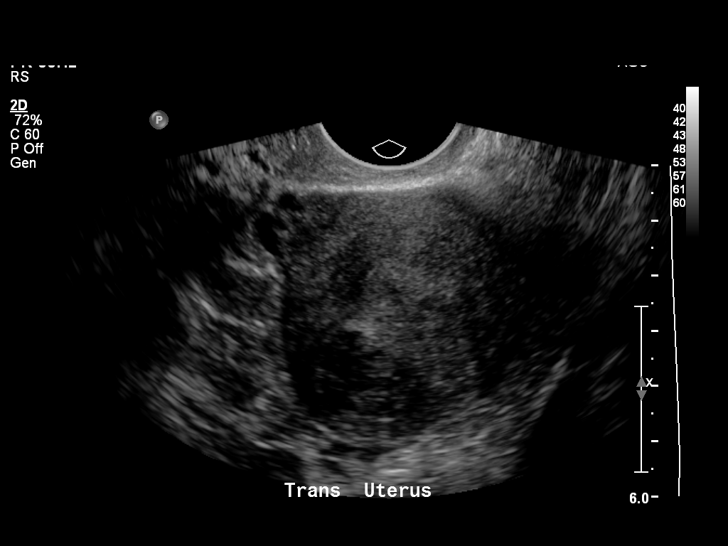
[im 17/42]
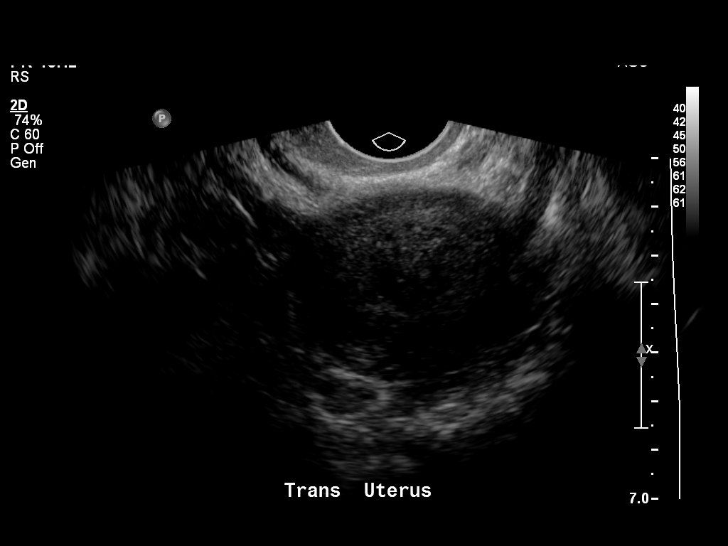
[im 20/42]
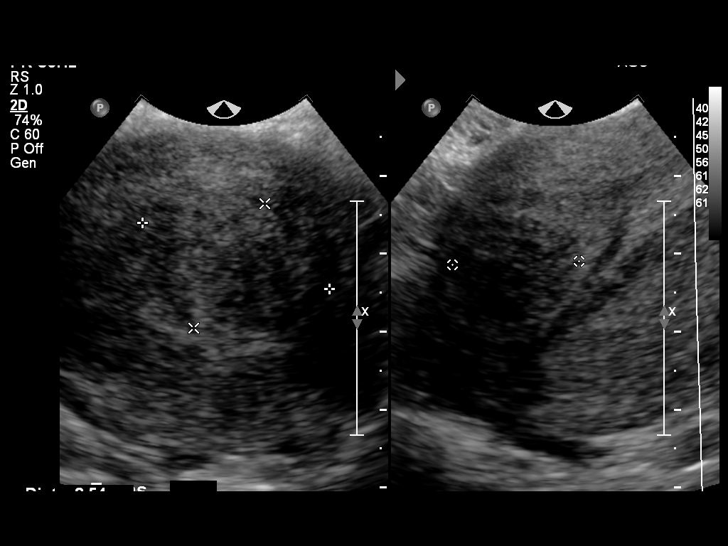
[im 23/42]
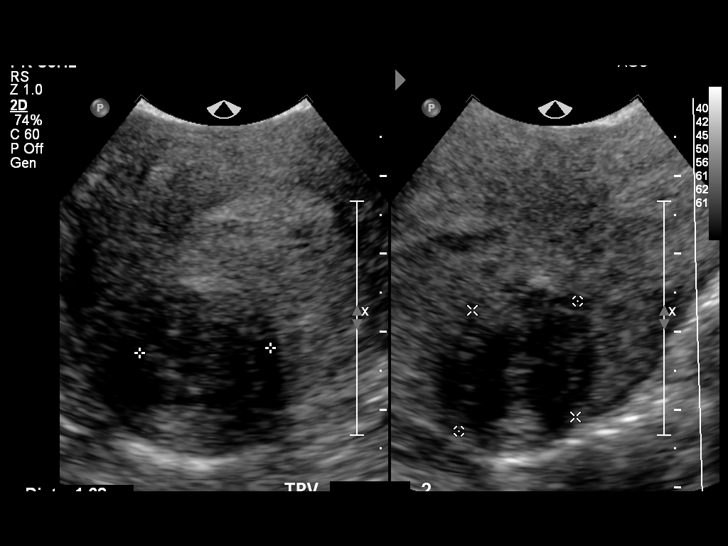
[im 26/42]
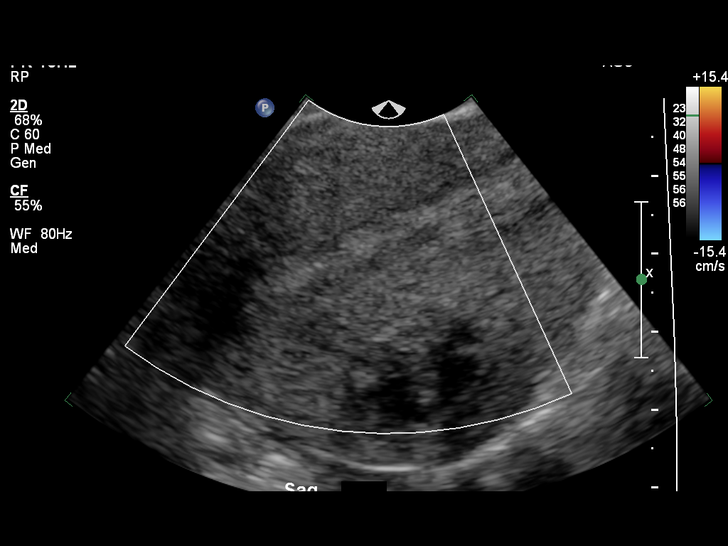
[im 29/42]
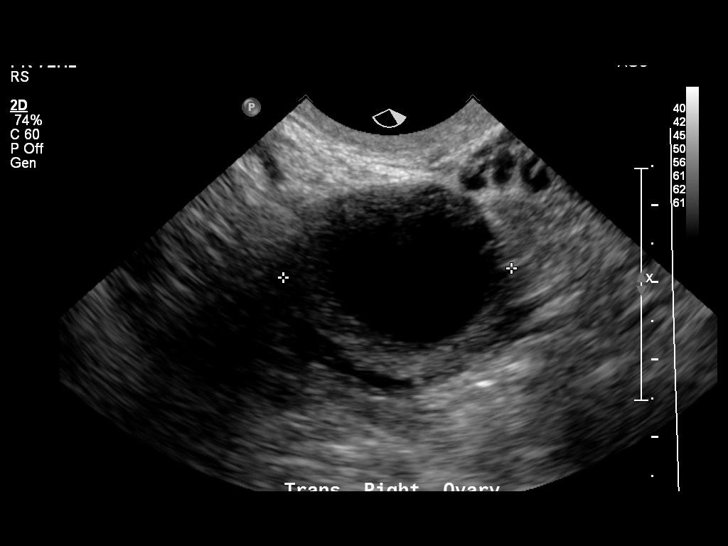
[im 32/42]
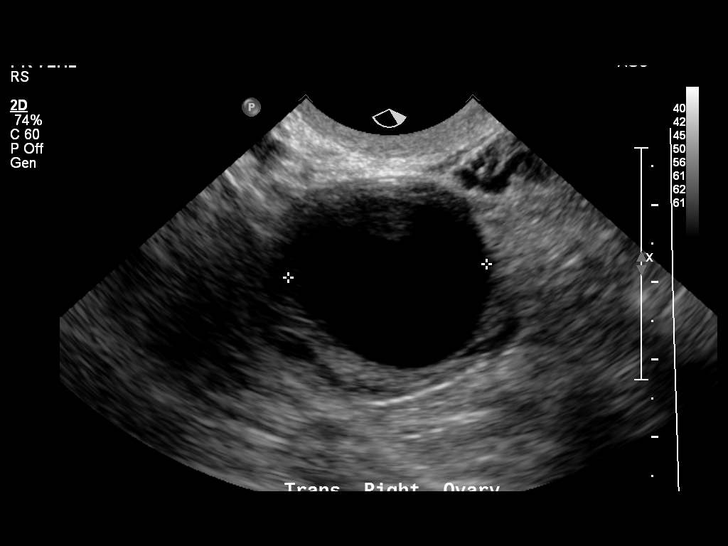
[im 35/42]
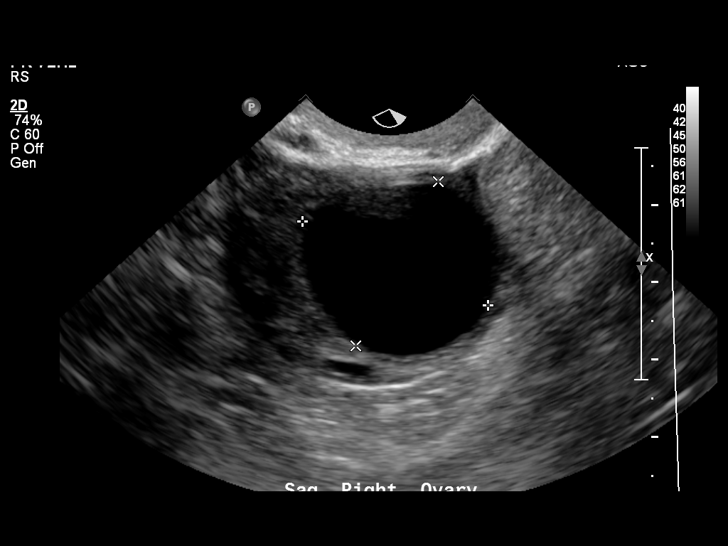
[im 38/42]
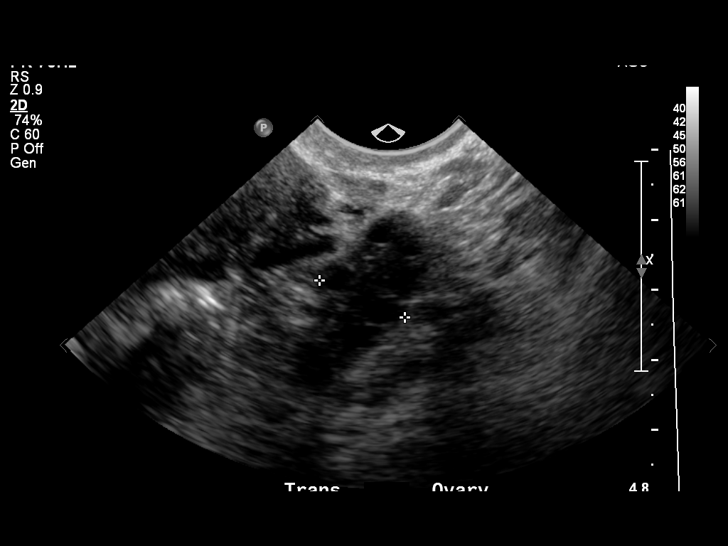
[im 42/42]
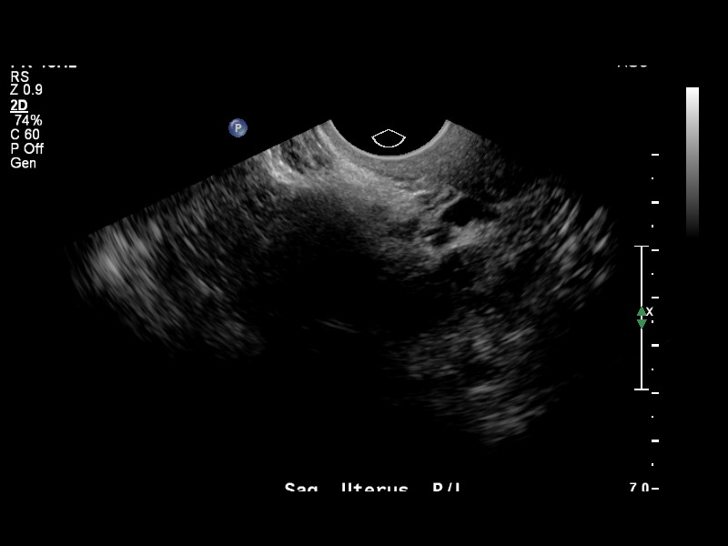

[14 of 28 positions shown; findings below may reference images not displayed]

FINDINGS: Previously seen intrauterine gestational sac is no longer
visualized. Endometrial stripe thickness is approximately 0.8 cm.
Two fibroids are identified measuring 2.5 and 2.3 cm. Right adnexal
cyst measuring 2.6 cm is unchanged.
IMPRESSION: Findings most consistent with completed spontaneous abortion.

Two small uterine fibroids.
